# Patient Record
Sex: Female | Born: 1980 | Race: Black or African American | Hispanic: No | Marital: Single | Smoking: Current every day smoker
Health system: Southern US, Community
[De-identification: ages and names within clinical notes are randomized; demographics above are authoritative.]

---

## 2011-03-11 ENCOUNTER — Encounter (HOSPITAL_COMMUNITY): Payer: Self-pay

## 2011-03-11 ENCOUNTER — Emergency Department (HOSPITAL_COMMUNITY)
Admission: EM | Admit: 2011-03-11 | Discharge: 2011-03-11 | Disposition: A | Discharge status: Home / Self Care | Payer: Self-pay | Source: Home / Self Care | Attending: Family Medicine | Admitting: Family Medicine

## 2011-03-11 DIAGNOSIS — S63509A Unspecified sprain of unspecified wrist, initial encounter: Secondary | ICD-10-CM

## 2011-03-11 DIAGNOSIS — S63502A Unspecified sprain of left wrist, initial encounter: Secondary | ICD-10-CM

## 2011-03-11 DIAGNOSIS — M65849 Other synovitis and tenosynovitis, unspecified hand: Secondary | ICD-10-CM

## 2011-03-11 MED ORDER — DICLOFENAC POTASSIUM 50 MG PO TABS: 50.0000 mg | ORAL_TABLET | Freq: Three times a day (TID) | ORAL | Status: AC

## 2011-03-11 NOTE — ED Notes (Signed)
Patient does not want x-ray due to cost.  MD made aware and x-ray canceled

## 2011-03-11 NOTE — ED Notes (Signed)
Pt has lt wrist pain and bruising on inner arm.  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

## 2011-03-11 NOTE — ED Provider Notes (Signed)
History     CSN: [id number]  Arrival date & time 03/11/11  1335   First MD Initiated Contact with Patient 03/11/11 1516      Chief Complaint  Patient presents with  . Wrist Pain    (Consider location/radiation/quality/duration/timing/severity/associated sxs/prior treatment) Patient is a 31 y.o. female presenting with wrist pain. The history is provided by the patient.  Wrist Pain This is a new problem. The current episode started yesterday (chronic pains from job activity but sudden worse pain in left wrist with lifting 50lb bag at work.). The problem has been gradually worsening. The symptoms are aggravated by bending.    No past medical history on file.  No past surgical history on file.  No family history on file.  History  Substance Use Topics  . Smoking status: Not on file  . Smokeless tobacco: Not on file  . Alcohol Use: No    OB History    Grav Para Term Preterm Abortions TAB SAB Ect Mult Living                  Review of Systems  Constitutional: Negative.   Musculoskeletal: Positive for joint swelling.    Allergies  Review of patient's allergies indicates no known allergies.  Home Medications   Current Outpatient Rx  Name Route Sig Dispense Refill  . ACETAMINOPHEN 325 MG PO TABS Oral Take 650 mg by mouth every 6 (six) hours as needed.    Marland Kitchen DICLOFENAC POTASSIUM 50 MG PO TABS Oral Take 1 tablet (50 mg total) by mouth 3 (three) times daily. 30 tablet 0    BP 119/89  Pulse 83  Temp(Src) 97.8 F (36.6 C) (Oral)  Resp 16  SpO2 99%  Physical Exam  Nursing note and vitals reviewed. Constitutional: She appears well-developed and well-nourished.  Musculoskeletal: She exhibits tenderness. She exhibits no edema.       Arms:   ED Course  Procedures (including critical care time)  Labs Reviewed - No data to display No results found.   1. Sprain of wrist, left   2. Forearm tendonitis       MDM           Barkley Bruns,  MD 03/11/11 [phone]

## 2015-05-14 ENCOUNTER — Emergency Department (HOSPITAL_BASED_OUTPATIENT_CLINIC_OR_DEPARTMENT_OTHER)
Admission: EM | Admit: 2015-05-14 | Discharge: 2015-05-14 | Disposition: A | Discharge status: Home / Self Care | Payer: Self-pay | Attending: Emergency Medicine | Admitting: Emergency Medicine

## 2015-05-14 ENCOUNTER — Encounter (HOSPITAL_BASED_OUTPATIENT_CLINIC_OR_DEPARTMENT_OTHER): Payer: Self-pay | Admitting: *Deleted

## 2015-05-14 DIAGNOSIS — L0291 Cutaneous abscess, unspecified: Secondary | ICD-10-CM

## 2015-05-14 DIAGNOSIS — F17210 Nicotine dependence, cigarettes, uncomplicated: Secondary | ICD-10-CM | POA: Insufficient documentation

## 2015-05-14 DIAGNOSIS — N611 Abscess of the breast and nipple: Secondary | ICD-10-CM | POA: Insufficient documentation

## 2015-05-14 DIAGNOSIS — L0390 Cellulitis, unspecified: Secondary | ICD-10-CM

## 2015-05-14 DIAGNOSIS — Z872 Personal history of diseases of the skin and subcutaneous tissue: Secondary | ICD-10-CM | POA: Insufficient documentation

## 2015-05-14 MED ORDER — OXYCODONE-ACETAMINOPHEN 5-325 MG PO TABS: 1.0000 | ORAL_TABLET | Freq: Once | ORAL | Status: DC

## 2015-05-14 MED ORDER — SULFAMETHOXAZOLE-TRIMETHOPRIM 800-160 MG PO TABS: 1.0000 | ORAL_TABLET | Freq: Two times a day (BID) | ORAL | Status: AC

## 2015-05-14 MED ORDER — LIDOCAINE-EPINEPHRINE (PF) 2 %-1:200000 IJ SOLN: 10.0000 mL | Freq: Once | INTRAMUSCULAR | Status: DC

## 2015-05-14 MED ORDER — KETOROLAC TROMETHAMINE 60 MG/2ML IM SOLN
INTRAMUSCULAR | Status: AC
  Filled 2015-05-14: qty 2

## 2015-05-14 MED ORDER — KETOROLAC TROMETHAMINE 60 MG/2ML IM SOLN
60.0000 mg | Freq: Once | INTRAMUSCULAR | Status: AC
  Administered 2015-05-14: 60 mg via INTRAMUSCULAR

## 2015-05-14 NOTE — Discharge Instructions (Signed)
If your pain and swelling are not improving after 3-4 days of antibiotics and warm compresses please return for drainage.  Hidradenitis Suppurativa Hidradenitis suppurativa is a long-term (chronic) skin disease that starts with blocked sweat glands or hair follicles. Bacteria may grow in these blocked openings of your skin. Hidradenitis suppurativa is like a severe form of acne that develops in areas of your body where acne would be unusual. It is most likely to affect the areas of your body where skin rubs against skin and becomes moist. This includes your:  Underarms.  Groin.  Genital areas.  Buttocks.  Upper thighs.  Breasts. Hidradenitis suppurativa may start out with small pimples. The pimples can develop into deep sores that break open (rupture) and drain pus. Over time your skin may thicken and become scarred. Hidradenitis suppurativa cannot be passed from person to person.  CAUSES  The exact cause of hidradenitis suppurativa is not known. This condition may be due to:  Female and female hormones. The condition is rare before and after puberty.  An overactive body defense system (immune system). Your immune system may overreact to the blocked hair follicles or sweat glands and cause swelling and pus-filled sores. RISK FACTORS You may have a higher risk of hidradenitis suppurativa if you:  Are a woman.  Are between ages 67 and 62.  Have a family history of hidradenitis suppurativa.  Have a personal history of acne.  Are overweight.  Smoke.  Take the drug lithium. SIGNS AND SYMPTOMS  The first signs of an outbreak are usually painful skin bumps that look like pimples. As the condition progresses:  Skin bumps may get bigger and grow deeper into the skin.  Bumps under the skin may rupture and drain smelly pus.  Skin may become itchy and infected.  Skin may thicken and scar.  Drainage may continue through tunnels under the skin (fistulas).  Walking and moving your  arms can become painful. DIAGNOSIS  Your health care provider may diagnose hidradenitis suppurativa based on your medical history and your signs and symptoms. A physical exam will also be done. You may need to see a health care provider who specializes in skin diseases (dermatologist). You may also have tests done to confirm the diagnosis. These can include:  Swabbing a sample of pus or drainage from your skin so it can be sent to the lab and tested for infection.  Blood tests to check for infection. TREATMENT  The same treatment will not work for everybody with hidradenitis suppurativa. Your treatment will depend on how severe your symptoms are. You may need to try several treatments to find what works best for you. Part of your treatment may include cleaning and bandaging (dressing) your wounds. You may also have to take medicines, such as the following:  Antibiotics.  Acne medicines.  Medicines to block or suppress the immune system.  A diabetes medicine (metformin) is sometimes used to treat this condition.  For women, birth control pills can sometimes help relieve symptoms. You may need surgery if you have a severe case of hidradenitis suppurativa that does not respond to medicine. Surgery may involve:   Using a laser to clear the skin and remove hair follicles.  Opening and draining deep sores.  Removing the areas of skin that are diseased and scarred. HOME CARE INSTRUCTIONS  Learn as much as you can about your disease, and work closely with your health care providers.  Take medicines only as directed by your health care provider.  If you were prescribed an antibiotic medicine, finish it all even if you start to feel better.  If you are overweight, losing weight may be very helpful. Try to reach and maintain a healthy weight.  Do not use any tobacco products, including cigarettes, chewing tobacco, or electronic cigarettes. If you need help quitting, ask your health care  provider.  Do not shave the areas where you get hidradenitis suppurativa.  Do not wear deodorant.  Wear loose-fitting clothes.  Try not to overheat and get sweaty.  Take a daily bleach bath as directed by your health care provider.  Fill your bathtub halfway with water.  Pour in  cup of unscented household bleach.  Soak for 5-10 minutes.  Cover sore areas with a warm, clean washcloth (compress) for 5-10 minutes. SEEK MEDICAL CARE IF:   You have a flare-up of hidradenitis suppurativa.  You have chills or a fever.  You are having trouble controlling your symptoms at home.   This information is not intended to replace advice given to you by your health care provider. Make sure you discuss any questions you have with your health care provider.   Document Released: 09/07/2003 Document Revised: 02/13/2014 Document Reviewed: 04/25/2013 Elsevier Interactive Patient Education [employer]2016 Elsevier Inc.

## 2015-05-14 NOTE — ED Provider Notes (Signed)
CSN: [id number]     Arrival date & time 05/14/15  1713 History   First MD Initiated Contact with Patient 05/14/15 1732     Chief Complaint  Patient presents with  . Abscess    HPI Pt is a 35 y.o. female with a history of multiple abscesses under her breasts who presents for redness and swelling of right breast for the past 2 weeks. Pt reports that initially it was just a little swollen and she has been doing warm compresses but it has become more painful and the redness around it is spreading so she has not been able to sleep because of the pain so she decided to get it checked out. No fever, chills, discharge from boil or nipple. No CP, SOB, n/v/d.  History reviewed. No pertinent past medical history. Past Surgical History  Procedure Laterality Date  . Cesarean section     No family history on file. Social History  Substance Use Topics  . Smoking status: Current Every Day Smoker -- 0.50 packs/day    Types: Cigarettes  . Smokeless tobacco: None  . Alcohol Use: Yes   OB History    No data available     Review of Systems  See HPI  Allergies  Review of patient's allergies indicates no known allergies.  Home Medications   Prior to Admission medications   Medication Sig Start Date End Date Taking? Authorizing Provider  acetaminophen (TYLENOL) 325 MG tablet Take 650 mg by mouth every 6 (six) hours as needed.    Historical Provider, MD  sulfamethoxazole-trimethoprim (BACTRIM DS,SEPTRA DS) 800-160 MG tablet Take 1 tablet by mouth 2 (two) times daily. 05/14/15   Abram Sander, MD   BP 127/78 mmHg  Pulse 84  Temp(Src) 98.7 F (37.1 C) (Oral)  Resp 18  Ht  (1.676 m)  Wt 68.04 kg  BMI 24.22 kg/m2  SpO2 100% Physical Exam  Constitutional: She is oriented to person, place, and time. She appears well-developed and well-nourished. No distress.  HENT:  Head: Normocephalic and atraumatic.  Eyes: Conjunctivae are normal. Pupils are equal, round, and reactive to light. Right eye  exhibits no discharge. Left eye exhibits no discharge. No scleral icterus.  Neck: Normal range of motion. Neck supple. No JVD present. No tracheal deviation present. No thyromegaly present.  Cardiovascular: Normal rate, regular rhythm, normal heart sounds and intact distal pulses.   No murmur heard. Pulmonary/Chest: Effort normal and breath sounds normal. No respiratory distress. She has no wheezes. She has no rales.  Abdominal: Soft. She exhibits no distension and no mass. There is no tenderness. There is no rebound and no guarding.  Lymphadenopathy:    She has no cervical adenopathy.  Neurological: She is alert and oriented to person, place, and time. No cranial nerve deficit.  Skin: Skin is warm and dry. She is not diaphoretic.     Psychiatric: She has a normal mood and affect. Her behavior is normal.  Nursing note and vitals reviewed.   ED Course  Procedures (including critical care time) Labs Review Labs Reviewed - No data to display  Imaging Review No results found. I have personally reviewed and evaluated these images and lab results as part of my medical decision-making.   EKG Interpretation None      MDM   Final diagnoses:  Abscess and cellulitis   Pt is a 35 y.o. female with apparent history of undiagnosed hidradenitis who presents for breast abscess with surrounding cellulitis. We discussed I&D as most  appropriate treatment but patient prefers to try antibiotics initially and if not resolving with this will return for drainage. Rx bactrim, continue warm compresses. Give toradol here for pain. Return for spreading, fevers, failure to improve over the next 3-4 days with antibiotics.   Abram SanderElena M Adamo, MD 05/14/15 1755  Marily MemosJason Mesner, MD 05/14/15 2016

## 2015-05-14 NOTE — ED Notes (Signed)
Red, pain, hot to touch right breast x 3 days.

## 2016-04-13 ENCOUNTER — Encounter (HOSPITAL_BASED_OUTPATIENT_CLINIC_OR_DEPARTMENT_OTHER): Payer: Self-pay | Admitting: Emergency Medicine

## 2016-04-13 ENCOUNTER — Emergency Department (HOSPITAL_BASED_OUTPATIENT_CLINIC_OR_DEPARTMENT_OTHER)
Admission: EM | Admit: 2016-04-13 | Discharge: 2016-04-13 | Disposition: A | Discharge status: Home / Self Care | Payer: 59 | Attending: Emergency Medicine | Admitting: Emergency Medicine

## 2016-04-13 DIAGNOSIS — F17210 Nicotine dependence, cigarettes, uncomplicated: Secondary | ICD-10-CM | POA: Insufficient documentation

## 2016-04-13 DIAGNOSIS — M791 Myalgia: Secondary | ICD-10-CM | POA: Insufficient documentation

## 2016-04-13 DIAGNOSIS — J029 Acute pharyngitis, unspecified: Secondary | ICD-10-CM | POA: Insufficient documentation

## 2016-04-13 DIAGNOSIS — R109 Unspecified abdominal pain: Secondary | ICD-10-CM | POA: Insufficient documentation

## 2016-04-13 LAB — PREGNANCY, URINE: PREG TEST UR: NEGATIVE

## 2016-04-13 LAB — RAPID STREP SCREEN (MED CTR MEBANE ONLY): STREPTOCOCCUS, GROUP A SCREEN (DIRECT): NEGATIVE

## 2016-04-13 MED ORDER — DEXAMETHASONE SODIUM PHOSPHATE 10 MG/ML IJ SOLN
10.0000 mg | Freq: Once | INTRAMUSCULAR | Status: AC
  Administered 2016-04-13: 10 mg via INTRAMUSCULAR
  Filled 2016-04-13: qty 1

## 2016-04-13 NOTE — ED Provider Notes (Signed)
[facility]   CSN: [id number] Arrival date & time: 04/13/16  2038  By signing my name below, I, Linna Darner, attest that this documentation has been prepared under the direction and in the presence of Felicie Morn, NP. Electronically Signed: Linna Darner, Scribe. 04/13/2016. 9:16 PM.  History   Chief Complaint Chief Complaint  Patient presents with  . Sore Throat    The history is provided by the patient. No language interpreter was used.  Sore Throat  This is a new problem. The current episode started yesterday. The problem occurs constantly. The problem has not changed since onset.Associated symptoms include abdominal pain. Pertinent negatives include no chest pain, no headaches and no shortness of breath. Nothing aggravates the symptoms. Nothing relieves the symptoms. Treatments tried: ibuprofen. The treatment provided no relief.     HPI Comments: Sarah Molina is a 36 y.o. female who presents to the Emergency Department complaining of a constant sore throat for a couple of days. She reports associated generalized body aches, subjective fever/chills, and intermittent lower abdominal pain. She has tried ibuprofen and salt water gargles with no improvement of her symptoms. Pt denies nausea, vomiting, cough, rhinorrhea, nasal congestion, or any other associated symptoms.  History reviewed. No pertinent past medical history.  There are no active problems to display for this patient.   Past Surgical History:  Procedure Laterality Date  . CESAREAN SECTION      OB History    No data available       Home Medications    Prior to Admission medications   Medication Sig Start Date End Date Taking? Authorizing Provider  acetaminophen (TYLENOL) 325 MG tablet Take 650 mg by mouth every 6 (six) hours as needed.    Historical Provider, MD  sulfamethoxazole-trimethoprim (BACTRIM DS,SEPTRA DS) 800-160 MG tablet Take 1 tablet by mouth 2 (two) times daily.  05/14/15   Abram Sander, MD    Family History History reviewed. No pertinent family history.  Social History Social History  Substance Use Topics  . Smoking status: Current Every Day Smoker    Packs/day: 0.50    Types: Cigarettes  . Smokeless tobacco: Never Used  . Alcohol use Yes     Allergies   Patient has no known allergies.   Review of Systems Review of Systems  Constitutional: Positive for chills and fever.  HENT: Positive for sore throat. Negative for congestion and rhinorrhea.   Respiratory: Negative for cough and shortness of breath.   Cardiovascular: Negative for chest pain.  Gastrointestinal: Positive for abdominal pain. Negative for nausea and vomiting.  Musculoskeletal: Positive for myalgias.  Neurological: Negative for headaches.  All other systems reviewed and are negative.   Physical Exam Updated Vital Signs BP 132/68 (BP Location: Left Arm)   Pulse 90   Temp 98.5 F (36.9 C) (Oral)   Resp 18   Ht 5\' 6"  (1.676 m)   Wt 150 lb (68 kg)   SpO2 97%   BMI 24.21 kg/m   Physical Exam  Constitutional: She is oriented to person, place, and time. She appears well-developed and well-nourished. No distress.  HENT:  Head: Normocephalic and atraumatic.  Nose: Mucosal edema present.  Mouth/Throat: Posterior oropharyngeal erythema present.  Eyes: Conjunctivae and EOM are normal.  Neck: Neck supple. No tracheal deviation present.  Cardiovascular: Normal rate.   Pulmonary/Chest: Effort normal. No respiratory distress.  Musculoskeletal: Normal range of motion.  Neurological: She is alert and oriented to person, place, and time.  Skin:  Skin is warm and dry.  Psychiatric: She has a normal mood and affect. Her behavior is normal.  Nursing note and vitals reviewed.   ED Treatments / Results  Labs (all labs ordered are listed, but only abnormal results are displayed) Labs Reviewed  RAPID STREP SCREEN (NOT AT [facility])  CULTURE, GROUP A STREP [facility])    EKG  EKG  Interpretation None       Radiology No results found.  Procedures Procedures (including critical care time)  DIAGNOSTIC STUDIES: Oxygen Saturation is 97% on RA, normal by my interpretation.    COORDINATION OF CARE: 9:21 PM Discussed treatment plan with pt at bedside and pt agreed to plan.  Medications Ordered in ED Medications  dexamethasone (DECADRON) injection 10 mg (10 mg Intramuscular Given 04/13/16 2205)     Initial Impression / Assessment and Plan / ED Course  I have reviewed the triage vital signs and the nursing notes.  Pertinent labs & imaging results that were available during my care of the patient were reviewed by me and considered in my medical decision making (see chart for details).    Pt with negative strep. Diagnosis of viral pharyngitis. No abx indicated at this time. Discussed that results of strep culture are pending and patient will be informed if positive result and abx will be called in at that time. Discharge with symptomatic tx. No evidence of dehydration. Pt is tolerating secretions. Presentation not concerning for peritonsillar abscess or spread of infection to deep spaces of the throat; patent airway. Specific return precautions discussed. Recommended PCP follow up. Pt appears safe for discharge.  Final Clinical Impressions(s) / ED Diagnoses   Final diagnoses:  Viral pharyngitis    New Prescriptions New Prescriptions   No medications on file   I personally performed the services described in this documentation, which was scribed in my presence. The recorded information has been reviewed and is accurate.    Felicie Mornavid Darshawn Boateng, NP 04/13/16 [id number]    Lyndal Pulleyaniel Knott, MD 04/14/16 [phone]

## 2016-04-13 NOTE — ED Triage Notes (Signed)
Patient states that she has had a sore throat x 3 days. Patient has tried some OTC medications. None have helped

## 2016-04-13 NOTE — ED Notes (Signed)
Pt verbalizes understanding of d/c instructions and denies any further needs at this time. 

## 2016-04-16 LAB — CULTURE, GROUP A STREP (THRC)

## 2016-08-22 ENCOUNTER — Emergency Department (HOSPITAL_BASED_OUTPATIENT_CLINIC_OR_DEPARTMENT_OTHER): Payer: BLUE CROSS/BLUE SHIELD

## 2016-08-22 ENCOUNTER — Emergency Department (HOSPITAL_BASED_OUTPATIENT_CLINIC_OR_DEPARTMENT_OTHER)
Admission: EM | Admit: 2016-08-22 | Discharge: 2016-08-23 | Disposition: A | Discharge status: Home / Self Care | Payer: BLUE CROSS/BLUE SHIELD | Attending: Emergency Medicine | Admitting: Emergency Medicine

## 2016-08-22 ENCOUNTER — Encounter (HOSPITAL_BASED_OUTPATIENT_CLINIC_OR_DEPARTMENT_OTHER): Payer: Self-pay | Admitting: *Deleted

## 2016-08-22 DIAGNOSIS — R200 Anesthesia of skin: Secondary | ICD-10-CM | POA: Diagnosis present

## 2016-08-22 DIAGNOSIS — F17210 Nicotine dependence, cigarettes, uncomplicated: Secondary | ICD-10-CM | POA: Diagnosis not present

## 2016-08-22 DIAGNOSIS — M79672 Pain in left foot: Secondary | ICD-10-CM | POA: Diagnosis not present

## 2016-08-22 DIAGNOSIS — M25512 Pain in left shoulder: Secondary | ICD-10-CM | POA: Diagnosis not present

## 2016-08-22 LAB — BASIC METABOLIC PANEL
Anion gap: 11 (ref 5–15)
BUN: 13 mg/dL (ref 6–20)
CO2: 21 mmol/L — ABNORMAL LOW (ref 22–32)
Calcium: 8.9 mg/dL (ref 8.9–10.3)
Chloride: 106 mmol/L (ref 101–111)
Creatinine, Ser: 1.07 mg/dL — ABNORMAL HIGH (ref 0.44–1.00)
GFR calc Af Amer: >60 mL/min (ref >60)
GFR calc non Af Amer: >60 mL/min (ref >60)
Glucose, Bld: 94 mg/dL (ref 65–99)
Potassium: 3.6 mmol/L (ref 3.5–5.1)
Sodium: 138 mmol/L (ref 135–145)

## 2016-08-22 LAB — CBC WITH DIFFERENTIAL/PLATELET
BASOS ABS: 0 10*3/uL (ref 0.0–0.1)
Basophils Relative: 0 %
Eosinophils Absolute: 0.1 10*3/uL (ref 0.0–0.7)
Eosinophils Relative: 2 %
HEMATOCRIT: 35.4 % — AB (ref 36.0–46.0)
Hemoglobin: 11.9 g/dL — ABNORMAL LOW (ref 12.0–15.0)
LYMPHS ABS: 3.5 10*3/uL (ref 0.7–4.0)
LYMPHS PCT: 53 %
MCH: 30.1 pg (ref 26.0–34.0)
MCHC: 33.6 g/dL (ref 30.0–36.0)
MCV: 89.4 fL (ref 78.0–100.0)
Monocytes Absolute: 0.5 10*3/uL (ref 0.1–1.0)
Monocytes Relative: 8 %
NEUTROS ABS: 2.5 10*3/uL (ref 1.7–7.7)
NEUTROS PCT: 37 %
PLATELETS: 179 10*3/uL (ref 150–400)
RBC: 3.96 MIL/uL (ref 3.87–5.11)
RDW: 13.9 % (ref 11.5–15.5)
WBC: 6.7 10*3/uL (ref 4.0–10.5)

## 2016-08-22 LAB — TROPONIN I: Troponin I: <0.03 ng/mL (ref <0.03)

## 2016-08-22 MED ORDER — IBUPROFEN 800 MG PO TABS
800.0000 mg | ORAL_TABLET | Freq: Once | ORAL | Status: AC
  Administered 2016-08-22: 800 mg via ORAL
  Filled 2016-08-22: qty 1

## 2016-08-22 NOTE — ED Provider Notes (Signed)
[facility]   CSN: [id number] Arrival date & time: 08/22/16  2240  By signing my name below, I, Rosana Fret, attest that this documentation has been prepared under the direction and in the presence of Dione Booze, MD. Electronically Signed: Rosana Fret, ED Scribe. 08/22/16. 11:42 PM.  History   Chief Complaint Chief Complaint  Patient presents with  . Numbness   The history is provided by the patient. No language interpreter was used.   HPI Comments: Sarah Molina is a 36 y.o. female who presents to the Emergency Department complaining of intermittent 7/10 pain and numbness in her left arm onset 12 hours ago. Pt describes pain as a tight pressure and the numbness as a pins and needles sensation. The pain and numbness are separate of each other. 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 Pt states pain is worse when laying down.  Pt reports associated dizziness. Per pt, she experiences similar pain in her left foot 3 weeks ago. Pt has tried ASA PTA with minimal relief. Pt endorses tobacco use of 0.5 PPD. No hx of HTN, DM and HLD. No FHx of CAD. Pt denies SOB, nausea, abnormal diaphoresis or any other complaints at this time.  History reviewed. No pertinent past medical history.  There are no active problems to display for this patient.   Past Surgical History:  Procedure Laterality Date  . CESAREAN SECTION      OB History    No data available       Home Medications    Prior to Admission medications   Medication Sig Start Date End Date Taking? Authorizing Provider  acetaminophen (TYLENOL) 325 MG tablet Take 650 mg by mouth every 6 (six) hours as needed.    [provider]  sulfamethoxazole-trimethoprim (BACTRIM DS,SEPTRA DS) 800-160 MG tablet Take 1 tablet by mouth 2 (two) times daily. 05/14/15   Abram Sander, MD    Family History No family history on  file.  Social History Social History  Substance Use Topics  . Smoking status: Current Every Day Smoker    Packs/day: 0.50    Types: Cigarettes  . Smokeless tobacco: Never Used  . Alcohol use Yes     Allergies   Patient has no known allergies.   Review of Systems Review of Systems  Constitutional: Negative for diaphoresis.  Respiratory: Negative for shortness of breath.   Gastrointestinal: Negative for nausea.  Musculoskeletal: Positive for myalgias.  Neurological: Positive for dizziness and numbness.  All other systems reviewed and are negative.    Physical Exam Updated Vital Signs BP (!) 142/77   Pulse 67   Temp 97.8 F (36.6 C) (Oral)   Resp 16   Ht 5\' 5"  (1.651 m)   Wt 170 lb (77.1 kg)   SpO2 100%   BMI 28.29 kg/m   Physical Exam  Constitutional: She is oriented to person, place, and time. She appears well-developed and well-nourished.  HENT:  Head: Normocephalic and atraumatic.  Eyes: Pupils are equal, round, and reactive to light. EOM are normal.  Neck: Normal range of motion. Neck supple. No JVD present.  Cardiovascular: Normal rate, regular rhythm and normal heart sounds.   No murmur heard. Pulmonary/Chest: Effort normal and breath sounds normal. She has no wheezes. She has no rales. She exhibits no tenderness.  Abdominal: Soft. Bowel sounds are normal. She exhibits no distension and no mass. There  is no tenderness.  Musculoskeletal: Normal range of motion. She exhibits tenderness. She exhibits no edema.  Tender left shoulder with maximin tenderness in her anterior deltoid grove. Rotator cuff impingement signs present. Distal neurovascular exam is normal.  Pain elicited when stress applied to plantar fascia of left foot.  Lymphadenopathy:    She has no cervical adenopathy.  Neurological: She is alert and oriented to person, place, and time. No cranial nerve deficit. She exhibits normal muscle tone. Coordination normal.  Skin: Skin is warm and dry. No  rash noted.  Psychiatric: She has a normal mood and affect. Her behavior is normal. Judgment and thought content normal.  Nursing note and vitals reviewed.    ED Treatments / Results  DIAGNOSTIC STUDIES: Oxygen Saturation is 100% on RA, normal by my interpretation.   COORDINATION OF CARE: 11:37 PM-Discussed next steps with pt including an XR. Pt verbalized understanding and is agreeable with the plan.   Labs (all labs ordered are listed, but only abnormal results are displayed) Labs Reviewed  BASIC METABOLIC PANEL  CBC WITH DIFFERENTIAL/PLATELET  TROPONIN I    EKG  EKG Interpretation  Date/Time:  Tuesday August 22 2016 23:04:09 EDT Ventricular Rate:  74 PR Interval:    QRS Duration: 81 QT Interval:  404 QTC Calculation: 449 R Axis:   74 Text Interpretation:  Sinus rhythm Baseline wander in lead(s) V6 Nonspecific ST abnormality No old tracing to compare Confirmed by Dione BoozeGlick, Millie Forde ([id number]) on 08/22/2016 11:28:52 PM       Radiology Dg Shoulder Left  Result Date: 08/23/2016 CLINICAL DATA:  Left shoulder numbness and tingling since yesterday. EXAM: LEFT SHOULDER - 2+ VIEW COMPARISON:  None. FINDINGS: There is no evidence of fracture or dislocation. There is no evidence of arthropathy or other focal bone abnormality. Soft tissues are unremarkable. IMPRESSION: Negative. Electronically Signed   By: Tollie Ethavid  Kwon M.D.   On: 08/23/2016 00:47    Procedures Procedures (including critical care time)  Medications Ordered in ED Medications  ibuprofen (ADVIL,MOTRIN) tablet 800 mg (not administered)     Initial Impression / Assessment and Plan / ED Course  I have reviewed the triage vital signs and the nursing notes.  Pertinent labs & imaging results that were available during my care of the patient were reviewed by me and considered in my medical decision making (see chart for details).  Pain and numbness in the left arm. Exam is strongly suggestive of rotator cuff injury as source  of her problems. No evidence of radiculopathy. Left foot pain which, clinically, appears to be plantar fasciitis. ECG and troponin are unremarkable. X-ray of left shoulder was normal. She feels considerably better after dose of ibuprofen. She is referred to sports medicine for her shoulder, and to podiatry for her foot. She does not have a PCP, so she is advised to contact [facility]. She is discharged with prescription for naproxen and advised to use over-the-counter acetaminophen as needed.  Final Clinical Impressions(s) / ED Diagnoses   Final diagnoses:  Acute pain of left shoulder  Pain in left foot    New Prescriptions New Prescriptions   NAPROXEN (NAPROSYN) 500 MG TABLET    Take 1 tablet (500 mg total) by mouth 2 (two) times daily.   I personally performed the services described in this documentation, which was scribed in my presence. The recorded information has been reviewed and is accurate.       Dione BoozeGlick, Calel Pisarski, MD 08/23/16 0111

## 2016-08-22 NOTE — ED Triage Notes (Addendum)
Numbness in her left arm off and on for the past 12 hours. 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 Dizziness. She took ASA tonight. She experienced pain in her left foot 3 weeks ago. She has been taking Ibuprofen for the pain with no relief.

## 2016-08-23 MED ORDER — NAPROXEN 500 MG PO TABS: 500.0000 mg | ORAL_TABLET | Freq: Two times a day (BID) | ORAL | 0 refills | Status: AC

## 2016-08-23 NOTE — Discharge Instructions (Signed)
Apply ice several times a day. You may take acetaminophen as needed for additional pain relief. °

## 2017-09-07 ENCOUNTER — Other Ambulatory Visit: Payer: Self-pay

## 2017-09-07 ENCOUNTER — Emergency Department (HOSPITAL_BASED_OUTPATIENT_CLINIC_OR_DEPARTMENT_OTHER)
Admission: EM | Admit: 2017-09-07 | Discharge: 2017-09-07 | Disposition: A | Discharge status: Home / Self Care | Payer: BLUE CROSS/BLUE SHIELD | Attending: Emergency Medicine | Admitting: Emergency Medicine

## 2017-09-07 ENCOUNTER — Encounter (HOSPITAL_BASED_OUTPATIENT_CLINIC_OR_DEPARTMENT_OTHER): Payer: Self-pay

## 2017-09-07 DIAGNOSIS — K089 Disorder of teeth and supporting structures, unspecified: Secondary | ICD-10-CM | POA: Insufficient documentation

## 2017-09-07 DIAGNOSIS — K0889 Other specified disorders of teeth and supporting structures: Secondary | ICD-10-CM

## 2017-09-07 DIAGNOSIS — F17210 Nicotine dependence, cigarettes, uncomplicated: Secondary | ICD-10-CM | POA: Insufficient documentation

## 2017-09-07 MED ORDER — KETOROLAC TROMETHAMINE 60 MG/2ML IM SOLN
60.0000 mg | Freq: Once | INTRAMUSCULAR | Status: AC
  Administered 2017-09-07: 60 mg via INTRAMUSCULAR
  Filled 2017-09-07: qty 2

## 2017-09-07 MED ORDER — IBUPROFEN 400 MG PO TABS: 400.0000 mg | ORAL_TABLET | Freq: Four times a day (QID) | ORAL | 0 refills | Status: AC | PRN

## 2017-09-07 MED ORDER — PENICILLIN V POTASSIUM 250 MG PO TABS
500.0000 mg | ORAL_TABLET | Freq: Once | ORAL | Status: AC
  Administered 2017-09-07: 500 mg via ORAL
  Filled 2017-09-07: qty 2

## 2017-09-07 MED ORDER — PENICILLIN V POTASSIUM 500 MG PO TABS: 500.0000 mg | ORAL_TABLET | Freq: Three times a day (TID) | ORAL | 0 refills | Status: AC

## 2017-09-07 MED ORDER — OXYCODONE-ACETAMINOPHEN 5-325 MG PO TABS: 2.0000 | ORAL_TABLET | Freq: Once | ORAL | Status: DC

## 2017-09-07 NOTE — ED Triage Notes (Signed)
C/o right upper toothache x 3 days-NAD-steady gait 

## 2017-09-07 NOTE — ED Provider Notes (Signed)
Emergency Department Provider Note   I have reviewed the triage vital signs and the nursing notes.   HISTORY  Chief Complaint Dental Pain   HPI Sarah Molina is a 37 y.o. female with fussiness past medical history presents emergency department today secondary to right upper first molar pain.  States that she is a positive before but the more persistent last few days with pain, swelling.  Throbbing nature.  No fevers.  She talk to the dentist but they cannot see her till Thursday.  She has only tried Tylenol at home and it has helped minimally with her symptoms.  No difficulty breathing or swallowing. No other associated or modifying symptoms.    History reviewed. No pertinent past medical history.  There are no active problems to display for this patient.   Past Surgical History:  Procedure Laterality Date  . CESAREAN SECTION      Current Outpatient Rx  . Order #: [id number] Class: Historical Med  . Order #: [id number] Class: Print  . Order #: [id number] Class: Print  . Order #: [id number] Class: Print  . Order #: [id number] Class: Print    Allergies Patient has no known allergies.  No family history on file.  Social History Social History   Tobacco Use  . Smoking status: Current Every Day Smoker    Packs/day: 0.50    Types: Cigarettes  . Smokeless tobacco: Never Used  Substance Use Topics  . Alcohol use: Yes    Comment: occ  . Drug use: No    Review of Systems  All other systems negative except as documented in the HPI. All pertinent positives and negatives as reviewed in the HPI. ____________________________________________   PHYSICAL EXAM:  VITAL SIGNS: ED Triage Vitals  Enc Vitals Group     BP 09/07/17 1640 (!) 143/88     Pulse Rate 09/07/17 1640 86     Resp 09/07/17 1640 16     Temp 09/07/17 1640 98.4 F (36.9 C)     Temp Source 09/07/17 1640 Oral     SpO2 09/07/17 1640 100 %     Weight 09/07/17 1640 163 lb 4.8 oz (74.1 kg)     Height 09/07/17  1640 5\' 6"  (1.676 m)    Constitutional: Alert and oriented. Well appearing and in no acute distress. Eyes: Conjunctivae are normal. PERRL. EOMI. Head: Atraumatic. Nose: No congestion/rhinnorhea. Mouth/Throat: Mucous membranes are moist.  Oropharynx non-erythematous. Mild swelling around upper right first molar.  Neck: No stridor.  No meningeal signs.   Cardiovascular: Normal rate, regular rhythm. Good peripheral circulation. Grossly normal heart sounds.   Respiratory: Normal respiratory effort.  No retractions. Lungs CTAB. Gastrointestinal: Soft and nontender. No distention.  Musculoskeletal: No lower extremity tenderness nor edema. No gross deformities of extremities. Neurologic:  Normal speech and language. No gross focal neurologic deficits are appreciated.  Skin:  Skin is warm, dry and intact. No rash noted.   ____________________________________________   INITIAL IMPRESSION / ASSESSMENT AND PLAN / ED COURSE  Dental pain likely secondary to infection.  No evidence of Ludewig's, abscess or other comp occasions.  Stable for discharge.  Dental follow-up were scheduled.     Pertinent labs & imaging results that were available during my care of the patient were reviewed by me and considered in my medical decision making (see chart for details).  ____________________________________________  FINAL CLINICAL IMPRESSION(S) / ED DIAGNOSES  Final diagnoses:  Pain, dental     MEDICATIONS GIVEN DURING THIS VISIT:  Medications  oxyCODONE-acetaminophen (PERCOCET/ROXICET) 5-325 MG  per tablet 2 tablet (2 tablets Oral Not Given 09/07/17 1752)  ketorolac (TORADOL) injection 60 mg (60 mg Intramuscular Given 09/07/17 1744)  penicillin v potassium (VEETID) tablet 500 mg (500 mg Oral Given 09/07/17 1744)     NEW OUTPATIENT MEDICATIONS STARTED DURING THIS VISIT:  Discharge Medication List as of 09/07/2017  5:21 PM    START taking these medications   Details  ibuprofen (ADVIL,MOTRIN) 400 MG  tablet Take 1 tablet (400 mg total) by mouth every 6 (six) hours as needed., Starting Fri 09/07/2017, Print    penicillin v potassium (VEETID) 500 MG tablet Take 1 tablet (500 mg total) by mouth 3 (three) times daily., Starting Fri 09/07/2017, Print        Note:  This note was prepared with assistance of Dragon voice recognition software. Occasional wrong-word or sound-a-like substitutions may have occurred due to the inherent limitations of voice recognition software.   Marily Memos, MD 09/07/17 [phone]

## 2017-09-07 NOTE — ED Notes (Signed)
Pt verbalizes understanding of d/c instructions and denies any further needs at this time. 

## 2018-02-04 ENCOUNTER — Emergency Department (HOSPITAL_BASED_OUTPATIENT_CLINIC_OR_DEPARTMENT_OTHER)
Admission: EM | Admit: 2018-02-04 | Discharge: 2018-02-05 | Disposition: A | Discharge status: Home / Self Care | Payer: 59 | Attending: Emergency Medicine | Admitting: Emergency Medicine

## 2018-02-04 ENCOUNTER — Emergency Department (HOSPITAL_BASED_OUTPATIENT_CLINIC_OR_DEPARTMENT_OTHER): Payer: 59

## 2018-02-04 ENCOUNTER — Encounter (HOSPITAL_BASED_OUTPATIENT_CLINIC_OR_DEPARTMENT_OTHER): Payer: Self-pay | Admitting: *Deleted

## 2018-02-04 ENCOUNTER — Other Ambulatory Visit: Payer: Self-pay

## 2018-02-04 DIAGNOSIS — M25562 Pain in left knee: Secondary | ICD-10-CM | POA: Insufficient documentation

## 2018-02-04 DIAGNOSIS — M79652 Pain in left thigh: Secondary | ICD-10-CM | POA: Insufficient documentation

## 2018-02-04 DIAGNOSIS — F17210 Nicotine dependence, cigarettes, uncomplicated: Secondary | ICD-10-CM | POA: Insufficient documentation

## 2018-02-04 DIAGNOSIS — T07XXXA Unspecified multiple injuries, initial encounter: Secondary | ICD-10-CM

## 2018-02-04 DIAGNOSIS — R0789 Other chest pain: Secondary | ICD-10-CM | POA: Diagnosis not present

## 2018-02-04 LAB — PREGNANCY, URINE: Preg Test, Ur: NEGATIVE

## 2018-02-04 MED ORDER — ACETAMINOPHEN 500 MG PO TABS
1000.0000 mg | ORAL_TABLET | Freq: Once | ORAL | Status: AC
  Administered 2018-02-04: 1000 mg via ORAL
  Filled 2018-02-04: qty 2

## 2018-02-04 NOTE — ED Notes (Signed)
Patient transported to X-ray 
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
## 2018-02-05 MED ORDER — HYDROXYZINE HCL 25 MG PO TABS: 25.0000 mg | ORAL_TABLET | Freq: Three times a day (TID) | ORAL | 0 refills | Status: AC | PRN

## 2018-02-05 NOTE — ED Notes (Signed)
Pt verbalized discharge instructions.

## 2018-08-13 IMAGING — DX DG SHOULDER 2+V*L*
3 series · 3 of 3 positions shown · non-contrast
Comparison: None.

CLINICAL DATA: Left shoulder numbness and tingling since yesterday.

EXAM:
LEFT SHOULDER - 2+ VIEW

[shoulder y view]
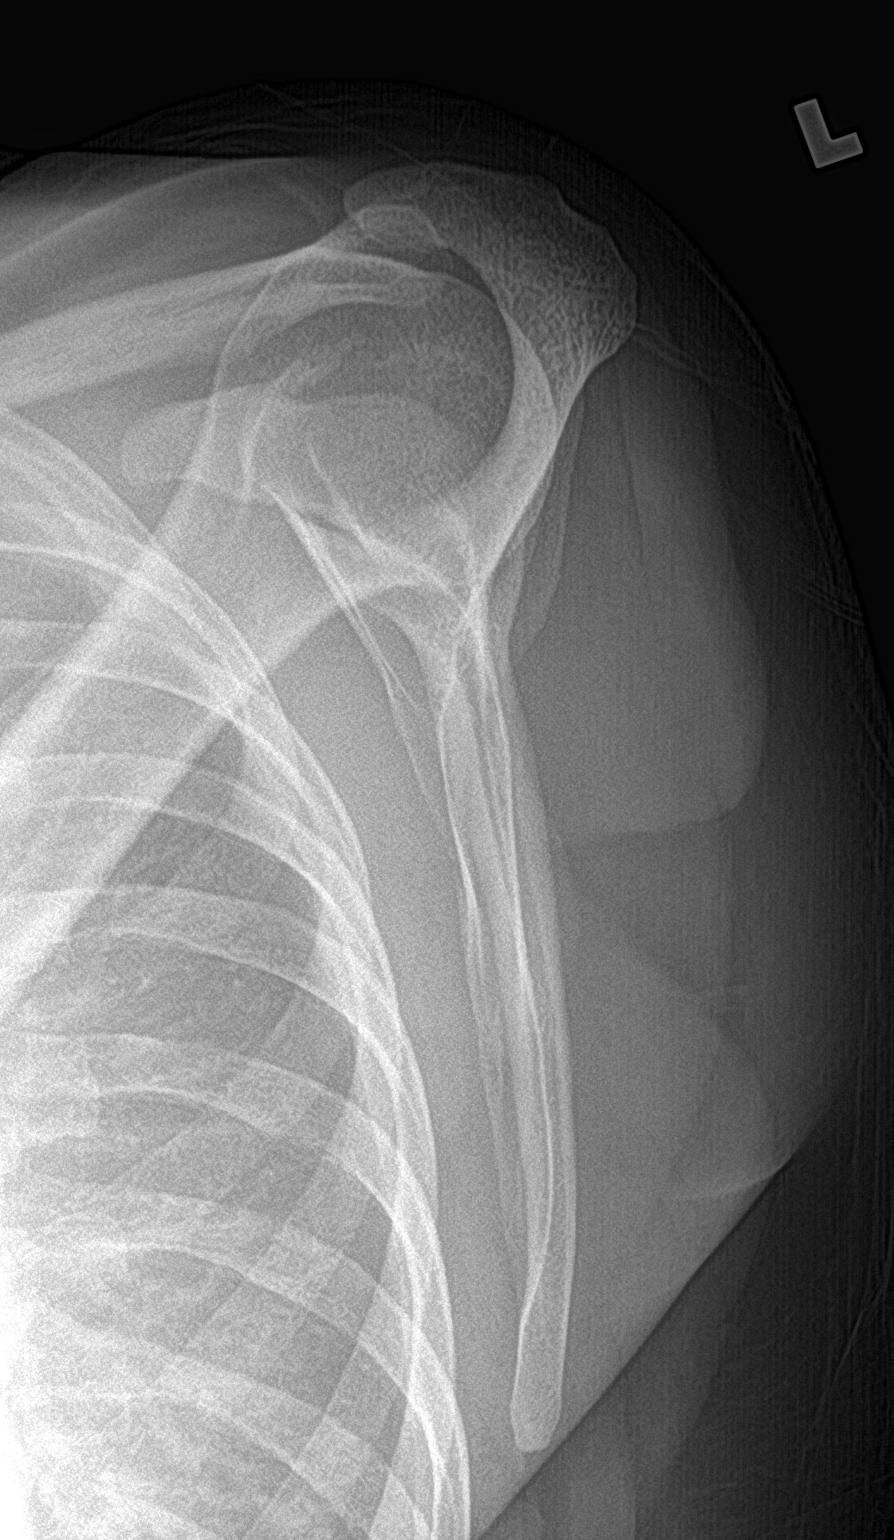

[shoulder axillary]
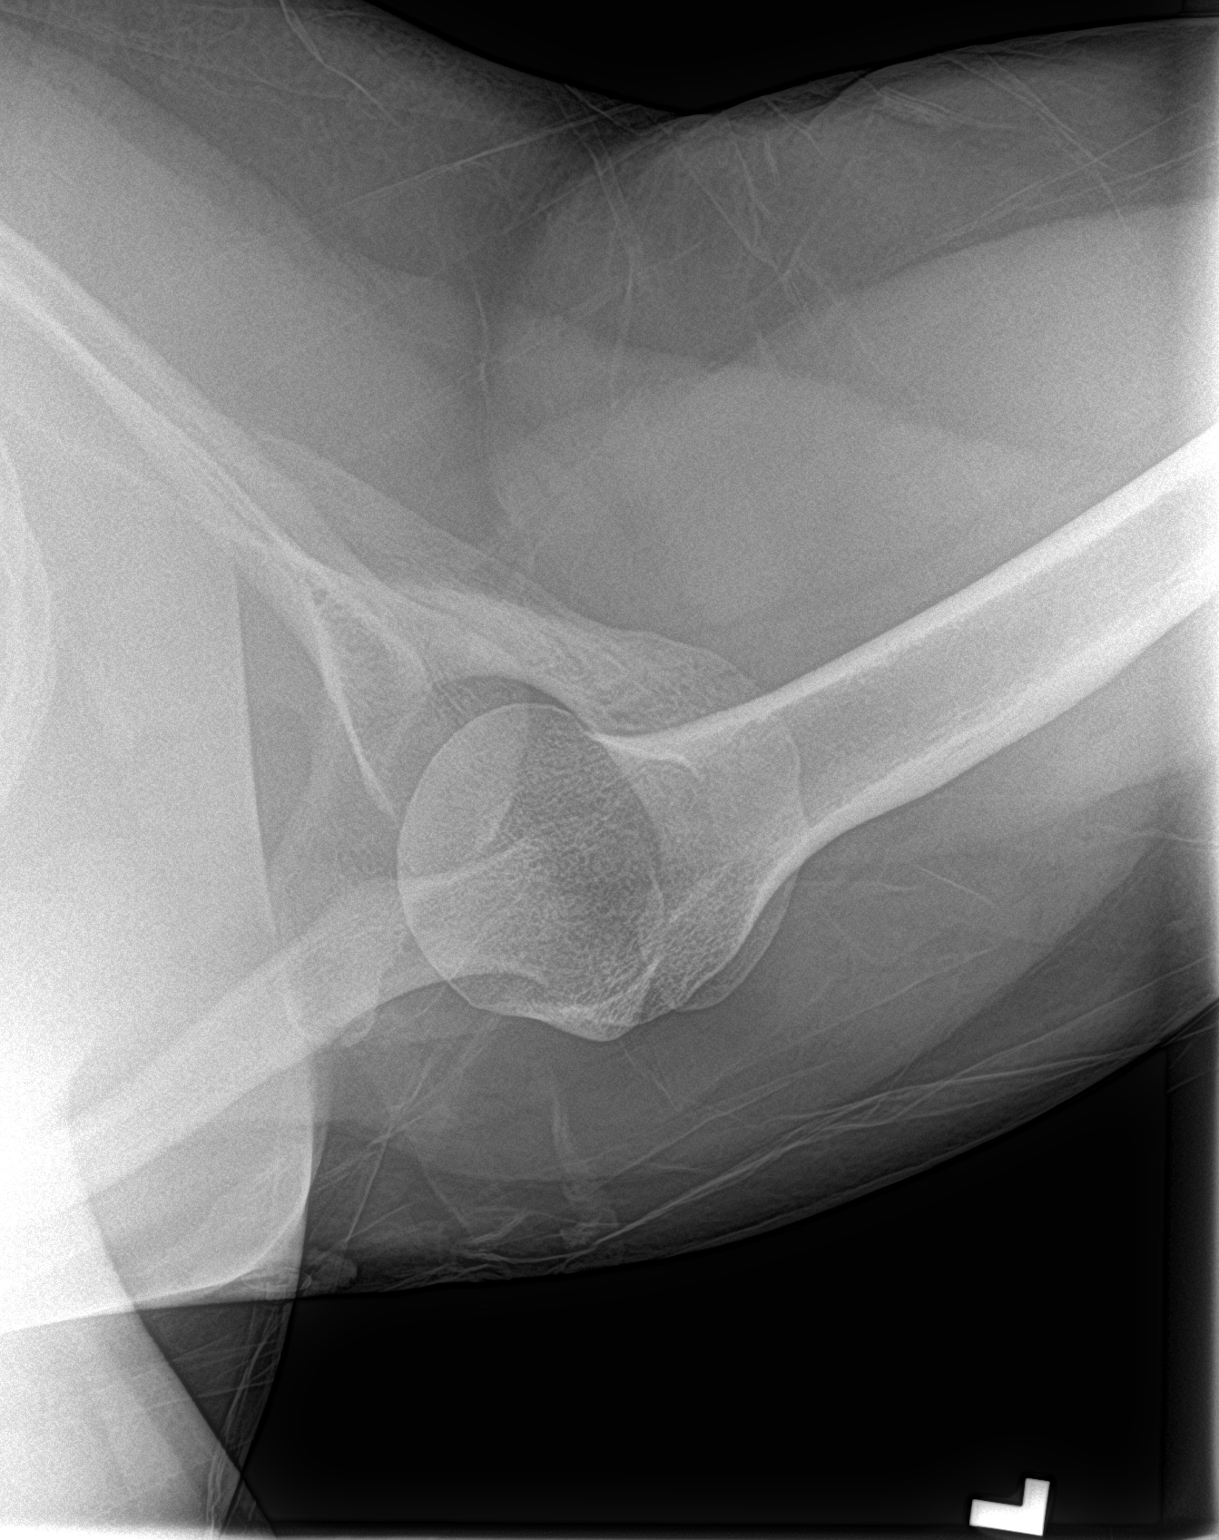

[shoulder grashey]
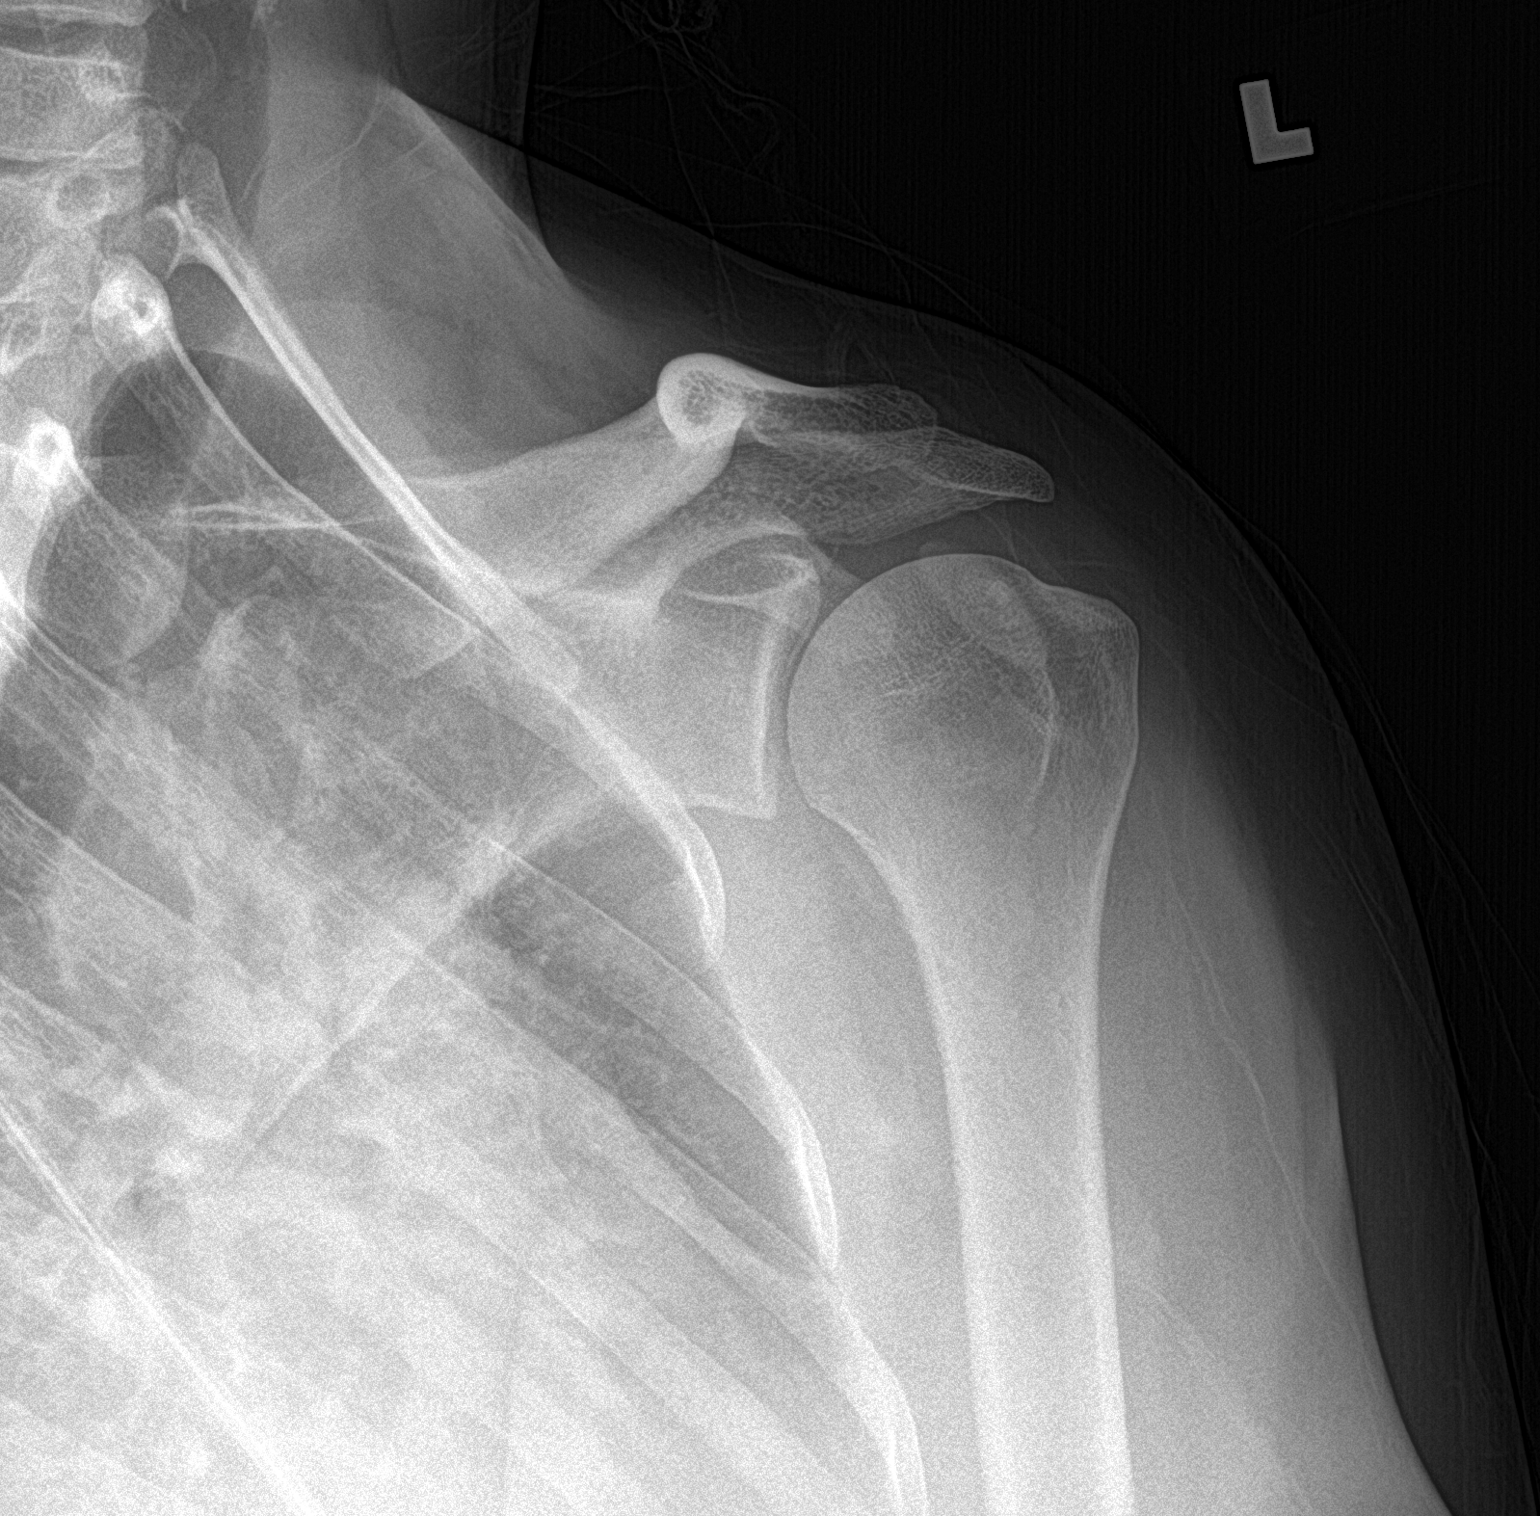

[3 of 3 positions shown; findings below may reference images not displayed]

FINDINGS: There is no evidence of fracture or dislocation. There is no
evidence of arthropathy or other focal bone abnormality. Soft
tissues are unremarkable.
IMPRESSION: Negative.

## 2019-05-30 ENCOUNTER — Emergency Department (HOSPITAL_BASED_OUTPATIENT_CLINIC_OR_DEPARTMENT_OTHER): Payer: BC Managed Care – PPO

## 2019-05-30 ENCOUNTER — Other Ambulatory Visit: Payer: Self-pay

## 2019-05-30 ENCOUNTER — Emergency Department (HOSPITAL_BASED_OUTPATIENT_CLINIC_OR_DEPARTMENT_OTHER)
Admission: EM | Admit: 2019-05-30 | Discharge: 2019-05-30 | Disposition: A | Discharge status: Home / Self Care | Payer: BC Managed Care – PPO | Attending: Emergency Medicine | Admitting: Emergency Medicine

## 2019-05-30 ENCOUNTER — Encounter (HOSPITAL_BASED_OUTPATIENT_CLINIC_OR_DEPARTMENT_OTHER): Payer: Self-pay

## 2019-05-30 DIAGNOSIS — Z79899 Other long term (current) drug therapy: Secondary | ICD-10-CM | POA: Diagnosis not present

## 2019-05-30 DIAGNOSIS — F17210 Nicotine dependence, cigarettes, uncomplicated: Secondary | ICD-10-CM | POA: Diagnosis not present

## 2019-05-30 DIAGNOSIS — R519 Headache, unspecified: Secondary | ICD-10-CM

## 2019-05-30 DIAGNOSIS — G44209 Tension-type headache, unspecified, not intractable: Secondary | ICD-10-CM | POA: Diagnosis not present

## 2019-05-30 DIAGNOSIS — M542 Cervicalgia: Secondary | ICD-10-CM | POA: Diagnosis not present

## 2019-05-30 DIAGNOSIS — M25562 Pain in left knee: Secondary | ICD-10-CM | POA: Insufficient documentation

## 2019-05-30 DIAGNOSIS — M545 Low back pain: Secondary | ICD-10-CM | POA: Diagnosis not present

## 2019-05-30 LAB — URINALYSIS, ROUTINE W REFLEX MICROSCOPIC
Bilirubin Urine: NEGATIVE
Glucose, UA: NEGATIVE mg/dL
Ketones, ur: NEGATIVE mg/dL
Leukocytes,Ua: NEGATIVE
Nitrite: NEGATIVE
Protein, ur: NEGATIVE mg/dL
Specific Gravity, Urine: 1.015 (ref 1.005–1.030)
pH: 7 (ref 5.0–8.0)

## 2019-05-30 LAB — BASIC METABOLIC PANEL
Anion gap: 8 (ref 5–15)
BUN: 15 mg/dL (ref 6–20)
CO2: 23 mmol/L (ref 22–32)
Calcium: 8.9 mg/dL (ref 8.9–10.3)
Chloride: 108 mmol/L (ref 98–111)
Creatinine, Ser: 1.26 mg/dL — ABNORMAL HIGH (ref 0.44–1.00)
GFR calc Af Amer: >60 mL/min (ref >60)
GFR calc non Af Amer: 54 mL/min — ABNORMAL LOW (ref >60)
Glucose, Bld: 95 mg/dL (ref 70–99)
Potassium: 3.8 mmol/L (ref 3.5–5.1)
Sodium: 139 mmol/L (ref 135–145)

## 2019-05-30 LAB — CBC
HCT: 39.3 % (ref 36.0–46.0)
Hemoglobin: 12.8 g/dL (ref 12.0–15.0)
MCH: 29.8 pg (ref 26.0–34.0)
MCHC: 32.6 g/dL (ref 30.0–36.0)
MCV: 91.6 fL (ref 80.0–100.0)
Platelets: 177 10*3/uL (ref 150–400)
RBC: 4.29 MIL/uL (ref 3.87–5.11)
RDW: 14.4 % (ref 11.5–15.5)
WBC: 7 10*3/uL (ref 4.0–10.5)
nRBC: 0 % (ref 0.0–0.2)

## 2019-05-30 LAB — URINALYSIS, MICROSCOPIC (REFLEX)

## 2019-05-30 LAB — PREGNANCY, URINE: Preg Test, Ur: NEGATIVE

## 2019-05-30 MED ORDER — METOCLOPRAMIDE HCL 5 MG/ML IJ SOLN
10.0000 mg | Freq: Once | INTRAMUSCULAR | Status: AC
  Administered 2019-05-30: 14:00:00 10 mg via INTRAVENOUS
  Filled 2019-05-30: qty 2

## 2019-05-30 MED ORDER — SODIUM CHLORIDE 0.9 % IV BOLUS
1000.0000 mL | Freq: Once | INTRAVENOUS | Status: AC
  Administered 2019-05-30: 14:00:00 1000 mL via INTRAVENOUS

## 2019-05-30 MED ORDER — SODIUM CHLORIDE 0.9 % IV SOLN: INTRAVENOUS | Status: DC

## 2019-05-30 MED ORDER — HYDROCODONE-ACETAMINOPHEN 5-325 MG PO TABS: 1.0000 | ORAL_TABLET | ORAL | 0 refills | Status: AC | PRN

## 2019-05-30 MED ORDER — KETOROLAC TROMETHAMINE 15 MG/ML IJ SOLN
15.0000 mg | Freq: Once | INTRAMUSCULAR | Status: AC
  Administered 2019-05-30: 14:00:00 15 mg via INTRAVENOUS
  Filled 2019-05-30: qty 1

## 2019-05-30 MED ORDER — DIAZEPAM 5 MG PO TABS: 5.0000 mg | ORAL_TABLET | Freq: Two times a day (BID) | ORAL | 0 refills | Status: AC

## 2019-05-30 MED ORDER — DIPHENHYDRAMINE HCL 50 MG/ML IJ SOLN
12.5000 mg | Freq: Once | INTRAMUSCULAR | Status: AC
  Administered 2019-05-30: 12.5 mg via INTRAVENOUS
  Filled 2019-05-30: qty 1
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
